# Patient Record
Sex: Female | Born: 1960 | Race: White | Hispanic: No | Marital: Married | Smoking: Never smoker
Health system: Southern US, Community
[De-identification: ages and names within clinical notes are randomized; demographics above are authoritative.]

## PROBLEM LIST (undated history)

## (undated) DIAGNOSIS — R0989 Other specified symptoms and signs involving the circulatory and respiratory systems: Secondary | ICD-10-CM

## (undated) DIAGNOSIS — T7840XA Allergy, unspecified, initial encounter: Secondary | ICD-10-CM

## (undated) DIAGNOSIS — F329 Major depressive disorder, single episode, unspecified: Secondary | ICD-10-CM

## (undated) DIAGNOSIS — F32A Depression, unspecified: Secondary | ICD-10-CM

## (undated) HISTORY — DX: Other specified symptoms and signs involving the circulatory and respiratory systems: R09.89

## (undated) HISTORY — PX: WISDOM TOOTH EXTRACTION: SHX21

## (undated) HISTORY — PX: JOINT REPLACEMENT: SHX530

## (undated) HISTORY — DX: Allergy, unspecified, initial encounter: T78.40XA

## (undated) HISTORY — PX: COLON SURGERY: SHX602

## (undated) HISTORY — DX: Major depressive disorder, single episode, unspecified: F32.9

## (undated) HISTORY — DX: Depression, unspecified: F32.A

---

## 2007-06-11 ENCOUNTER — Ambulatory Visit: Payer: Self-pay | Admitting: General Surgery

## 2007-12-30 ENCOUNTER — Ambulatory Visit: Payer: Self-pay | Admitting: Family Medicine

## 2009-08-17 IMAGING — RF DG UGI W/ SMALL BOWEL
2 series · 15 of 24 positions shown · non-contrast
Comparison: none

REASON FOR EXAM: diarrhea,   family history of Crohns
COMMENTS:

PROCEDURE:     FL  - FL UPPER GI WITH SMALL BOWEL  - June 11, 2007 [DATE]
RESULT:
HISTORY: Family history of Crohn's disease.

[Series 1: run · 7 of 13 slices shown (1 of 2)]
[im 1/13]
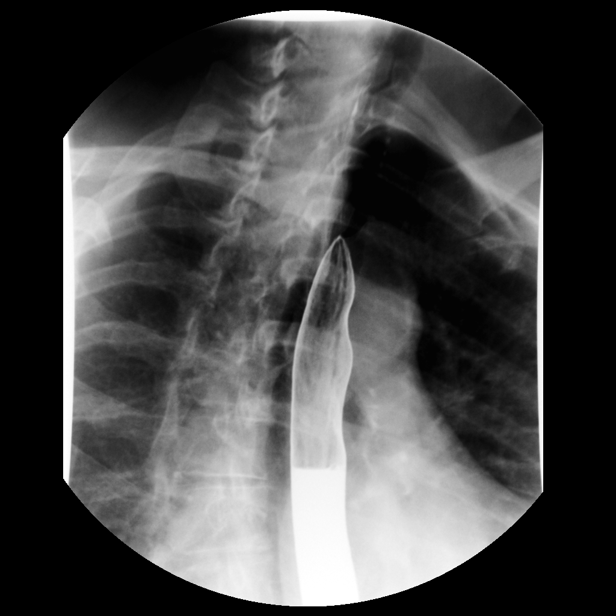
[im 3/13]
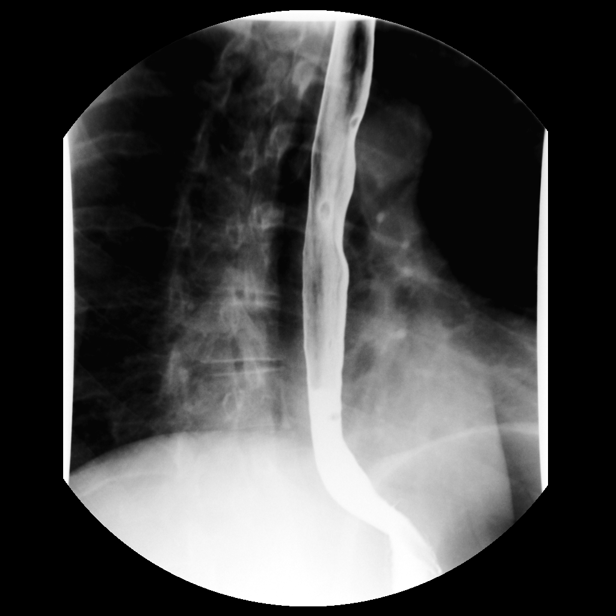
[im 5/13]
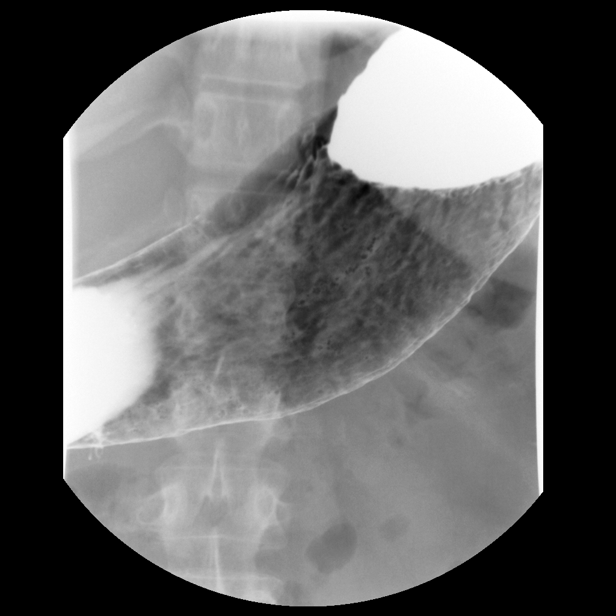
[im 6/13]
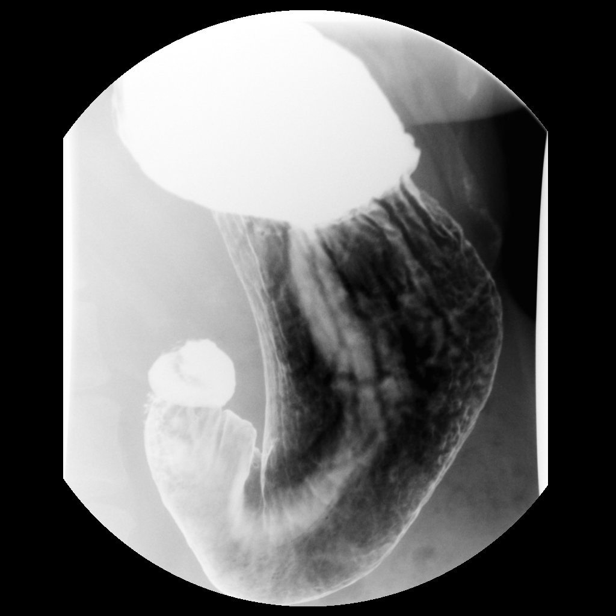
[im 8/13]
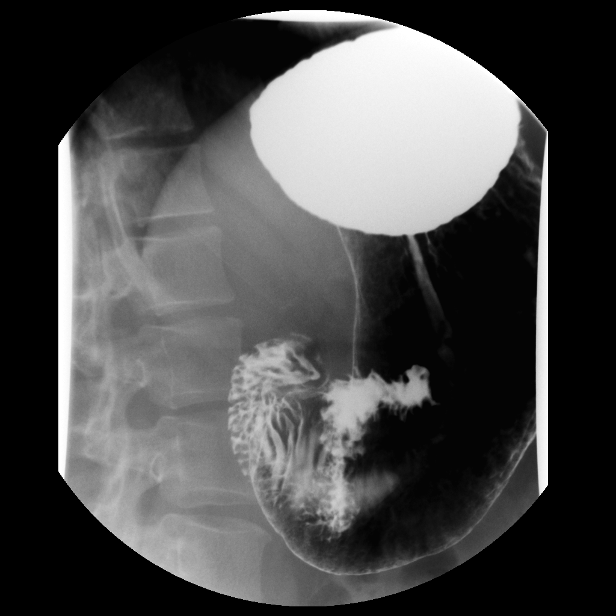
[im 9/13]
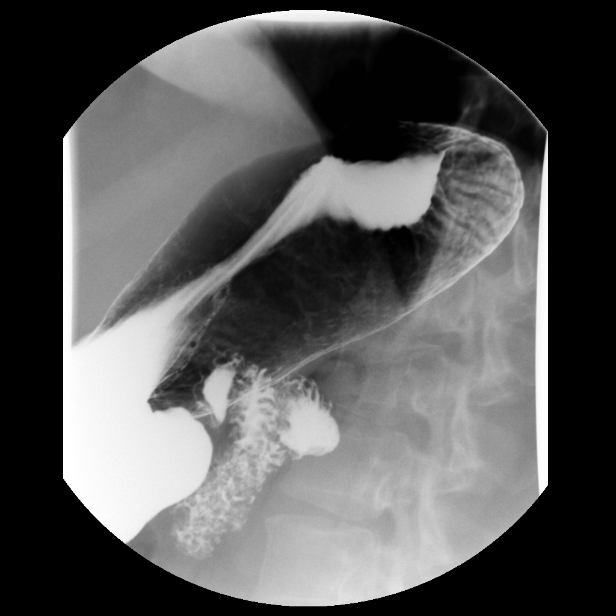
[im 11/13]
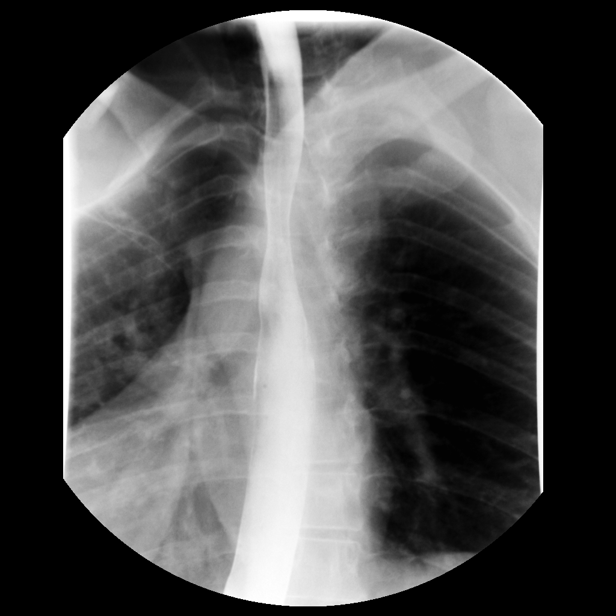

[Series 2: run · 8 of 14 slices shown (2 of 2)]
[im 1/14]
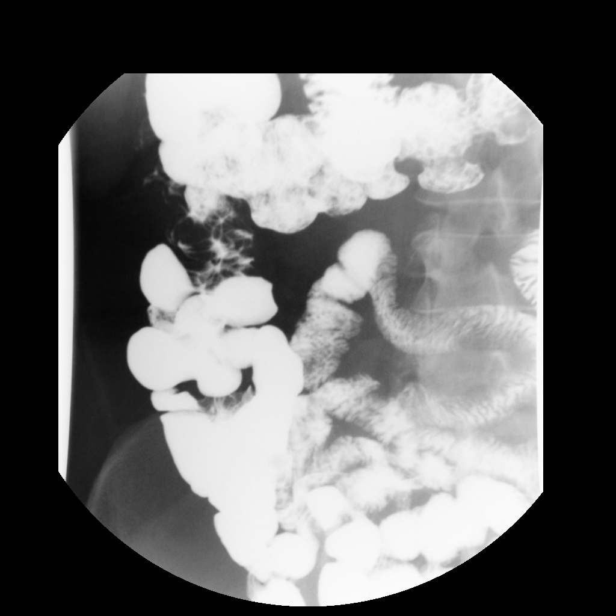
[im 2/14]
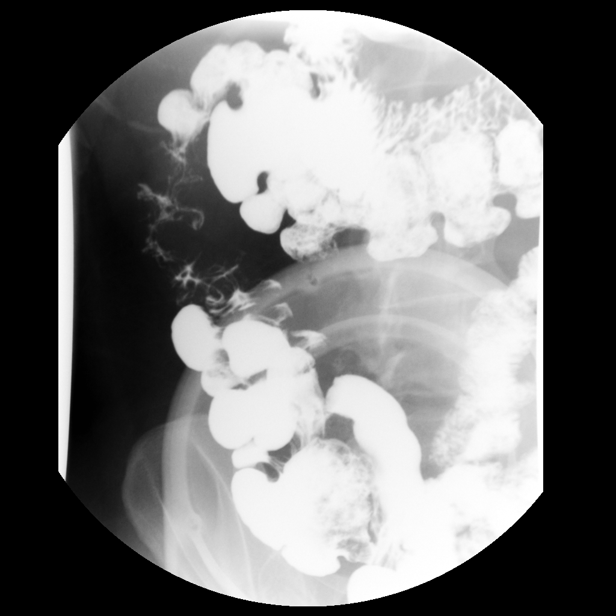
[im 4/14]
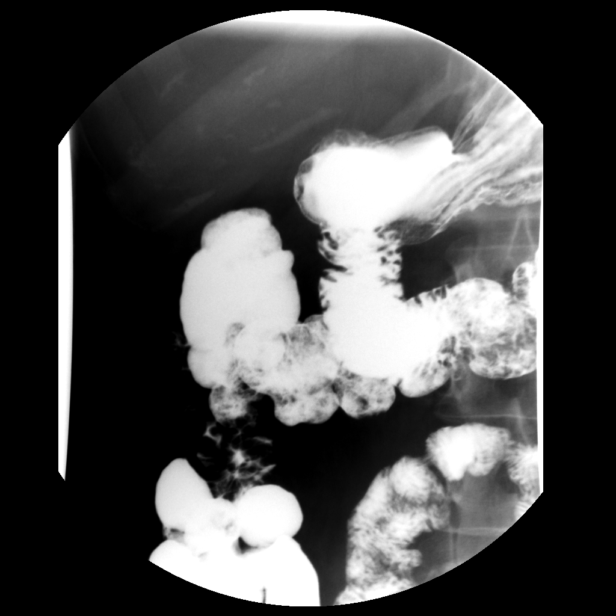
[im 5/14]
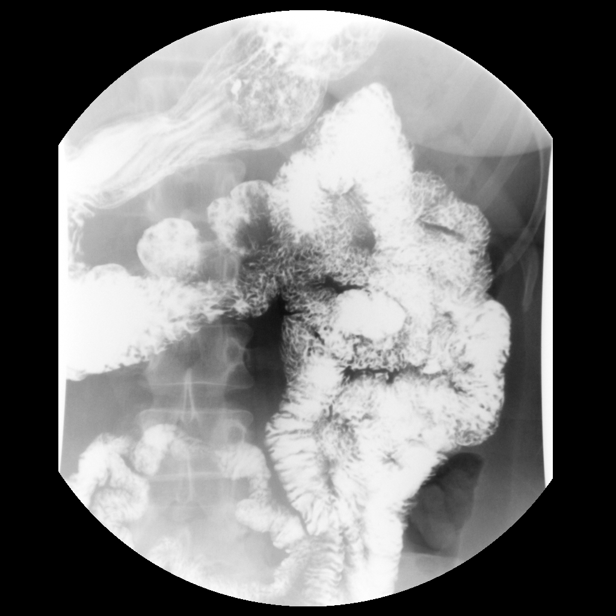
[im 8/14]
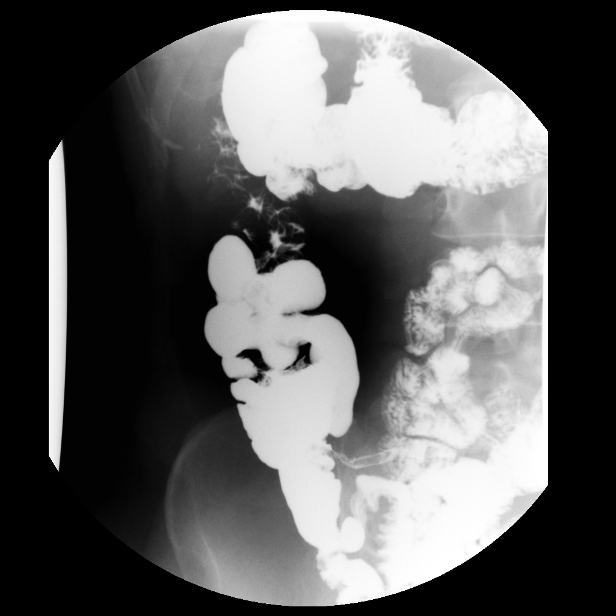
[im 10/14]
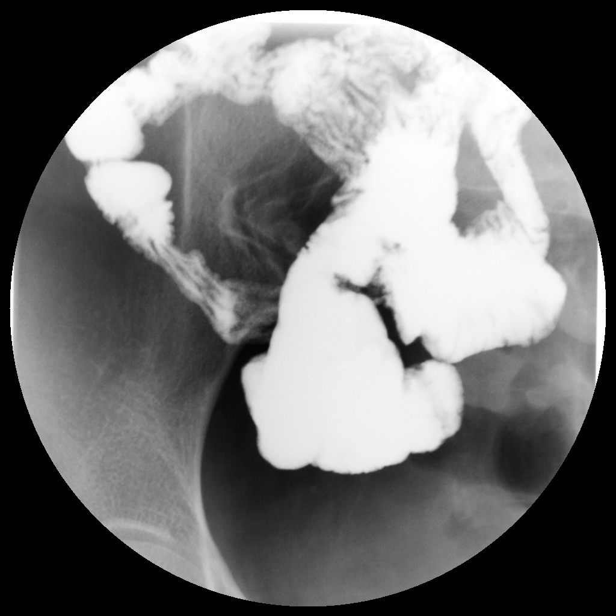
[im 11/14]
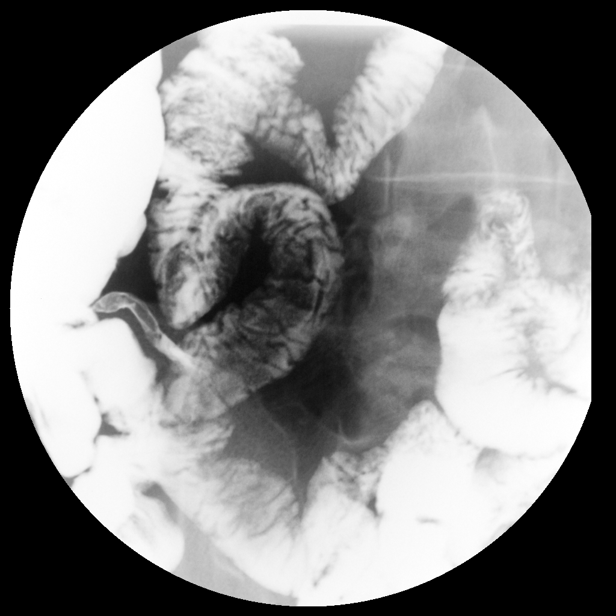
[im 14/14]
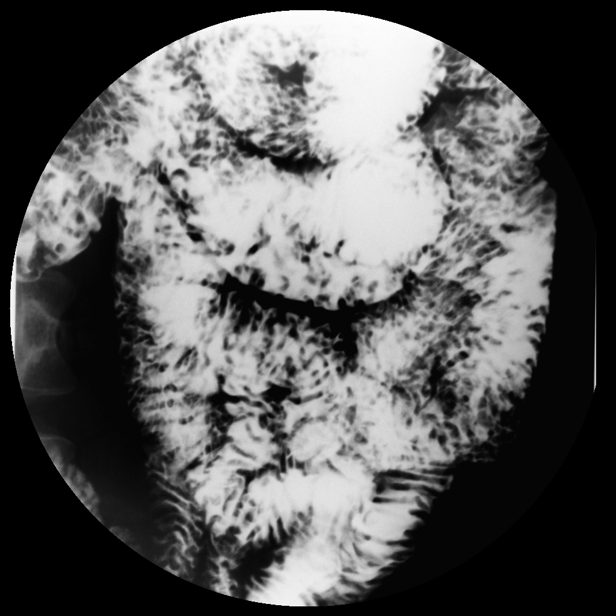

[15 of 24 positions shown; findings below may reference images not displayed]

FINDINGS: Esophageal and gastric mucosal pattern, peristaltic activity and
contour are normal.  The duodenal bulb and C-loop are normal. Mild
gastroesophageal reflux is present. The small bowel is normal. Fold
thickness and caliber is unremarkable. The terminal ileum is normal. The
appendix is present. The colon cannot be evaluated, and if Crohn's disease
is suspected, colonoscopy may prove useful.
IMPRESSION: 1.     Mild gastroesophageal reflux.
2.     Otherwise normal upper GI and small bowel follow-through.
3.     No evidence of Crohn's disease involving the small bowel.
4.     The terminal ileum appears normal.

## 2009-09-17 ENCOUNTER — Ambulatory Visit: Payer: Self-pay | Admitting: Internal Medicine

## 2010-03-07 IMAGING — CR DG CHEST 2V
1 series · 2 of 2 positions shown · non-contrast
Comparison: none

REASON FOR EXAM: cough
COMMENTS:

[Series 1: view not recorded · 0.17mm/px · 2 of 2 slices shown]
[im 1/2]
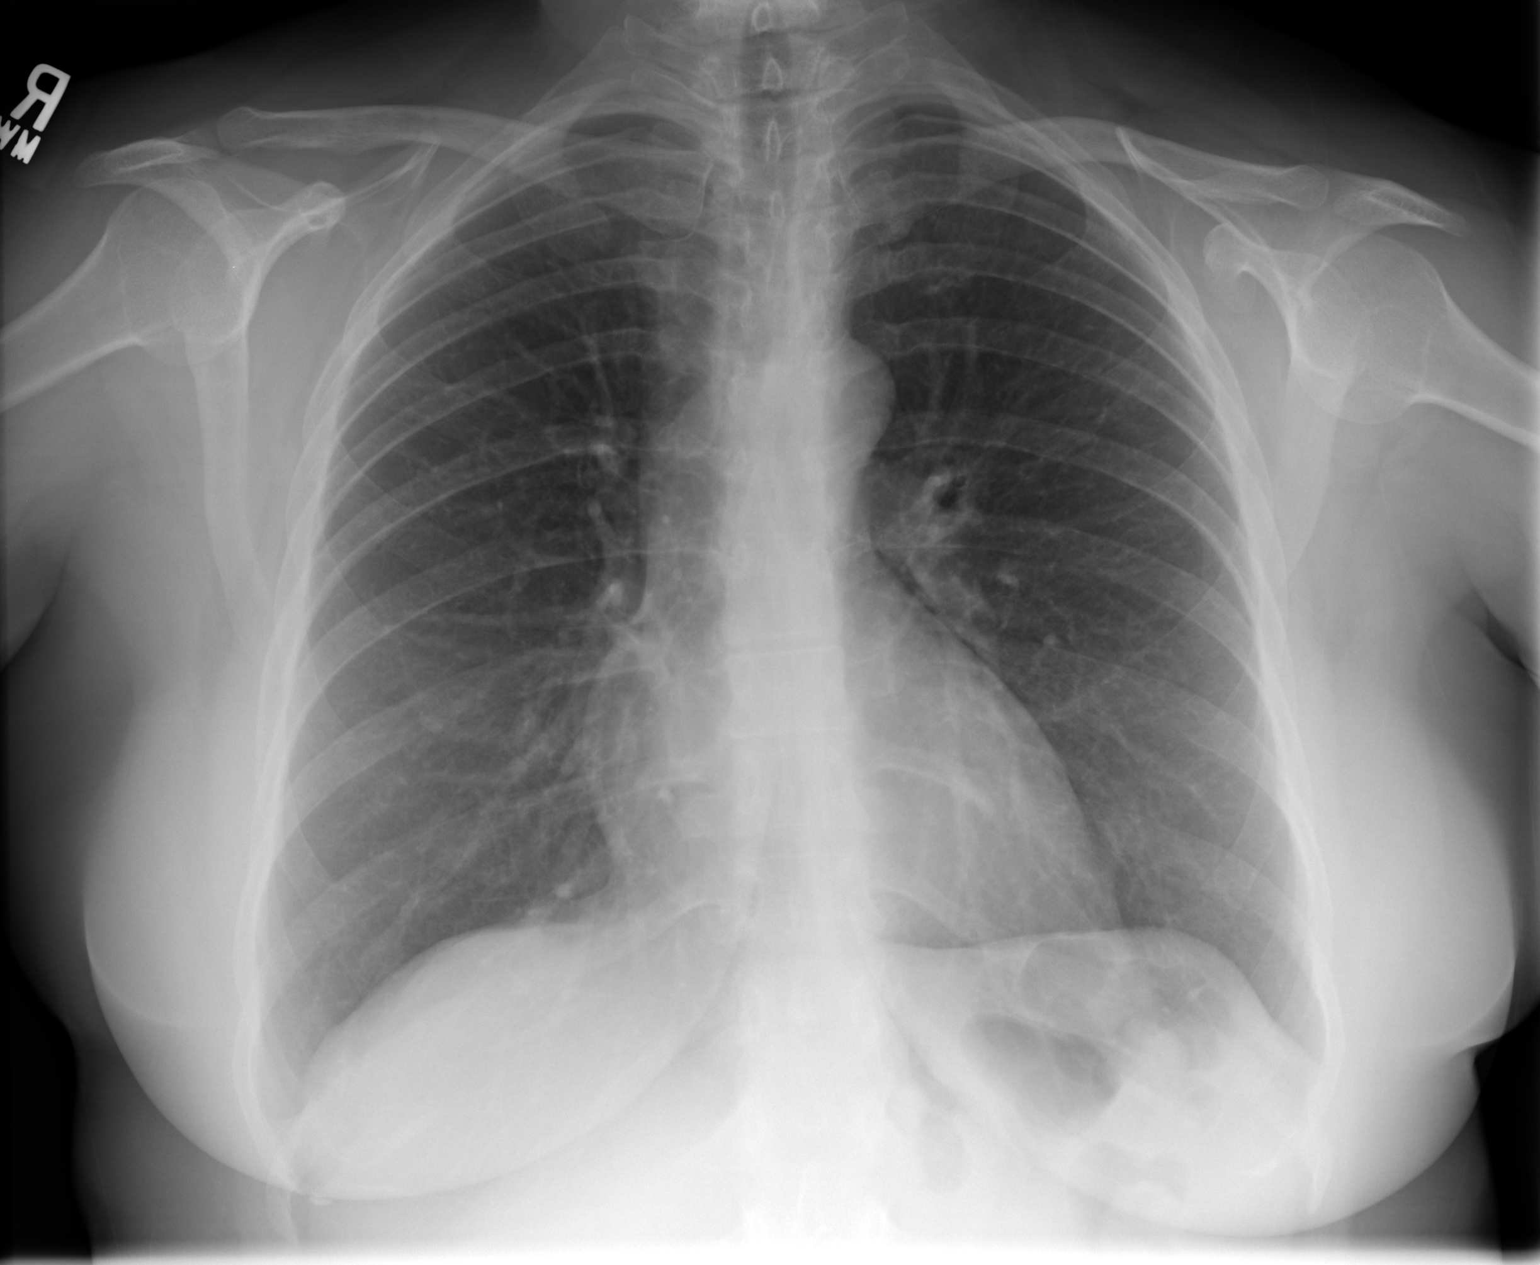
[im 2/2]
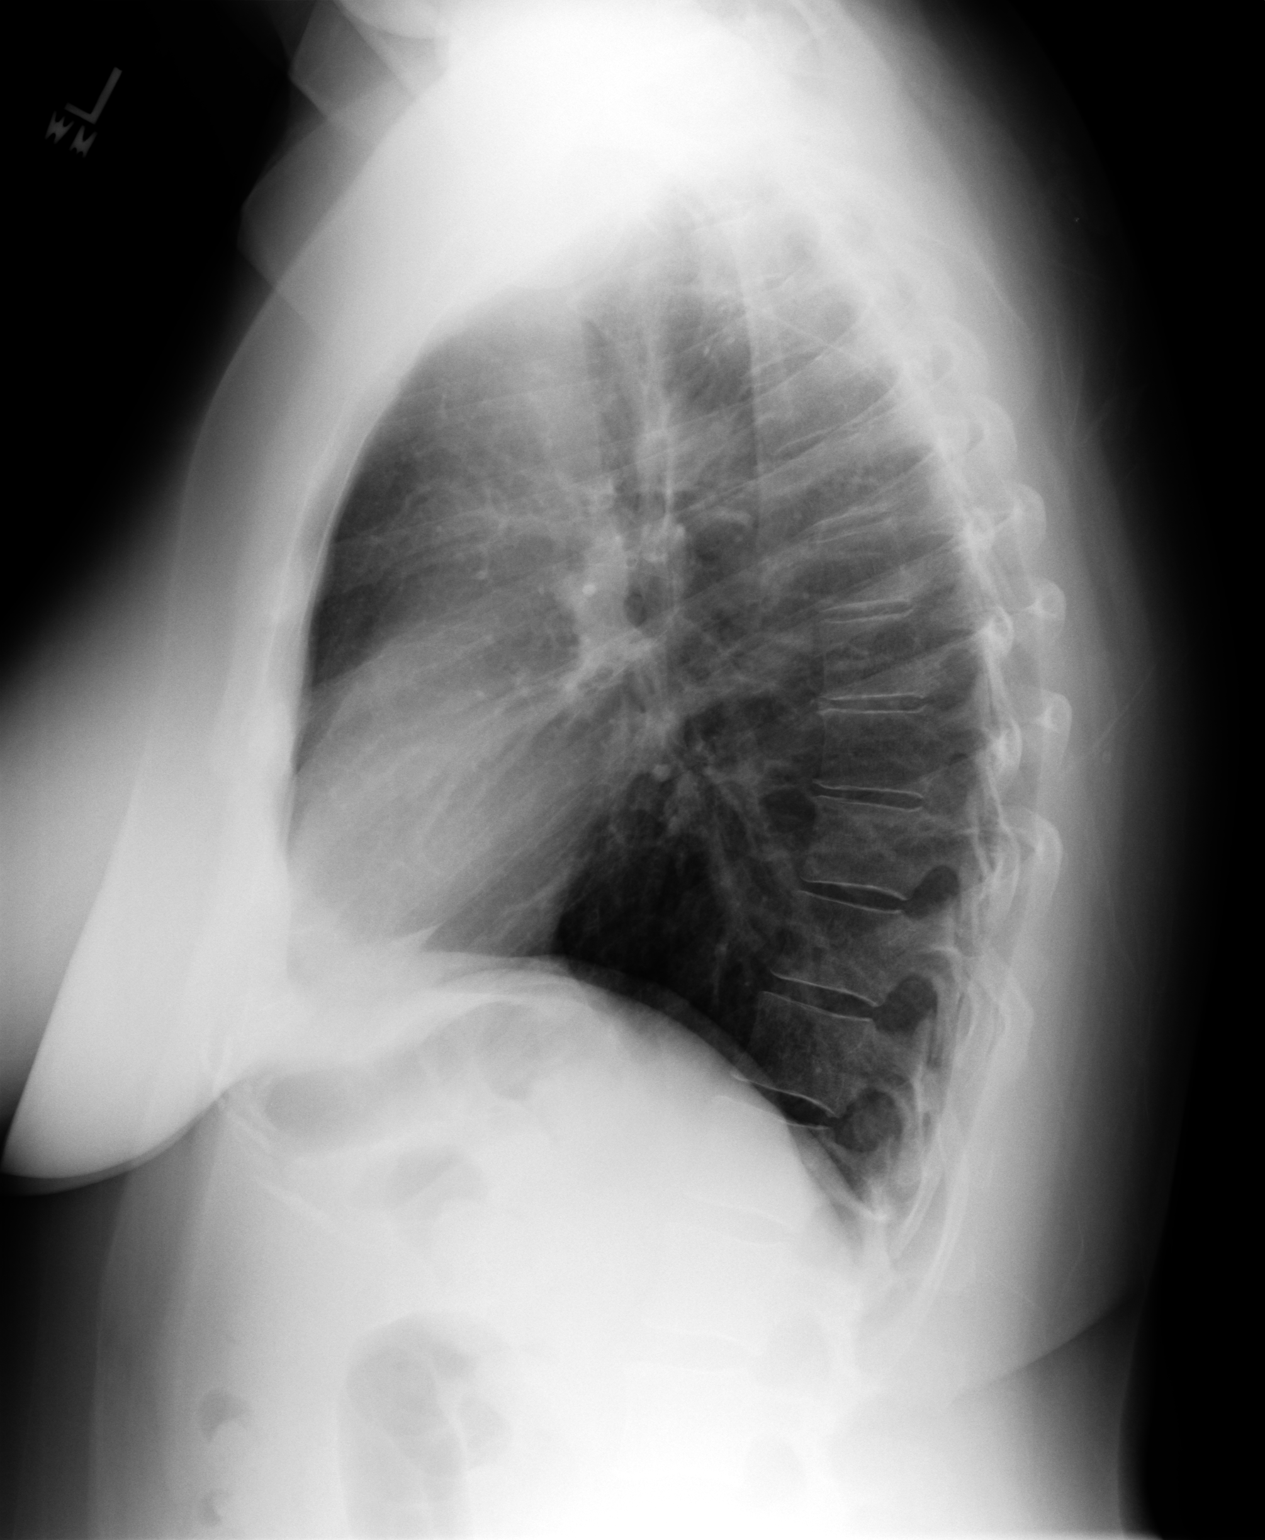

[2 of 2 positions shown; findings below may reference images not displayed]

PROCEDURE:     DXR - DXR CHEST PA (OR AP) AND LATERAL  - December 30, 2007  [DATE]

RESULT:     Has no previous examination for comparison.

The lungs are clear. The heart and pulmonary vessels are normal. The bony
and mediastinal structures are unremarkable. There is no effusion. There is
no pneumothorax or evidence of congestive failure.
IMPRESSION: No acute cardiopulmonary disease.

## 2011-09-27 ENCOUNTER — Encounter: Payer: Self-pay | Admitting: Obstetrics & Gynecology

## 2011-09-27 ENCOUNTER — Ambulatory Visit (INDEPENDENT_AMBULATORY_CARE_PROVIDER_SITE_OTHER): Payer: BC Managed Care – PPO | Admitting: Obstetrics & Gynecology

## 2011-09-27 VITALS — BP 132/98 | HR 77 | Ht 62.0 in | Wt 187.0 lb

## 2011-09-27 DIAGNOSIS — Z124 Encounter for screening for malignant neoplasm of cervix: Secondary | ICD-10-CM

## 2011-09-27 DIAGNOSIS — L910 Hypertrophic scar: Secondary | ICD-10-CM

## 2011-09-27 DIAGNOSIS — R232 Flushing: Secondary | ICD-10-CM

## 2011-09-27 DIAGNOSIS — Z01419 Encounter for gynecological examination (general) (routine) without abnormal findings: Secondary | ICD-10-CM

## 2011-09-27 MED ORDER — CONJ ESTROG-MEDROXYPROGEST ACE 0.45-1.5 MG PO TABS: 1.0000 | ORAL_TABLET | Freq: Every day | ORAL | Status: DC

## 2011-09-27 NOTE — Progress Notes (Signed)
Subjective:     Patient ID: Amy Evans, female   DOB: Mar 03, 1960, 51 y.o.   MRN: [id number]  HPI Pt is a G3P3003 for annual GYN exam.  Pt c/o severe vasomotor sx, mood changes and insomnia.  LMP 01/09/09.  Wants to consider HRT as she has 'tried everything else.'  Pt has large skin tags on rectum that she wants evaluated  Past Medical History  Diagnosis Date  . Allergy   . Depression   . Pulse irregularity    Soc Hx: no tob or etoh FH: non contributory PShx: colon fistula repair    Review of Systems    non contributary   Objective:   Physical ExamPt in NAD Afebrile VSS Lungs: CTA CV: RRR Abd: obses, ND, NT GU: EGBUS: no lesions Vagina: no blood in vault Cervix: no lesion; no mucopurulent d/c Uterus: small, mobile Adnexa: no masses; non tender  Rectum: 2 large ~2cm keloids extruding from rectum.         Assessment:     Menapausal sx Keloids on rectum and mons Routine GYN exam    Plan:    f/u PAP/ HPV F/u for treatment of Keloids- wants injection of Kenalog Premprom 0.45/1.5mg  daily  Amy Evans, M.D., Evern Core

## 2011-09-27 NOTE — Patient Instructions (Signed)

## 2011-10-10 ENCOUNTER — Ambulatory Visit: Payer: Self-pay | Admitting: Family Medicine

## 2011-11-27 ENCOUNTER — Encounter: Payer: Self-pay | Admitting: Obstetrics & Gynecology

## 2011-11-27 ENCOUNTER — Ambulatory Visit (INDEPENDENT_AMBULATORY_CARE_PROVIDER_SITE_OTHER): Payer: BC Managed Care – PPO | Admitting: Obstetrics & Gynecology

## 2011-11-27 VITALS — BP 128/92 | HR 77 | Ht 62.0 in | Wt 184.0 lb

## 2011-11-27 DIAGNOSIS — N951 Menopausal and female climacteric states: Secondary | ICD-10-CM

## 2011-11-27 MED ORDER — CONJ ESTROG-MEDROXYPROGEST ACE 0.625-2.5 MG PO TABS: 1.0000 | ORAL_TABLET | Freq: Every day | ORAL | Status: DC

## 2011-11-27 NOTE — Patient Instructions (Signed)

## 2011-11-27 NOTE — Progress Notes (Signed)
Subjective:     Patient ID: Amy Evans, female   DOB: 1960-07-14, 51 y.o.   MRN: [id number]  HPI  Pt started on ERT last visit for severe menopausal sx.  Pt reports that her life is much better now,  Decrease in vasomotor sx.  Pt is sleeping better but, she is still waking up nightly with hot flushes.  Past Medical History  Diagnosis Date  . Allergy   . Depression   . Pulse irregularity    Current outpatient prescriptions:atenolol (TENORMIN) 25 MG tablet, , Disp: , Rfl: ;  cetirizine (ZYRTEC) 10 MG tablet, Take 10 mg by mouth daily., Disp: , Rfl: ;  naproxen sodium (ANAPROX) 550 MG tablet, , Disp: , Rfl: ;  DISCONTD: estrogen, conjugated,-medroxyprogesterone (PREMPRO) 0.45-1.5 MG per tablet, Take 1 tablet by mouth daily., Disp: 30 tablet, Rfl: 2 estrogen, conjugated,-medroxyprogesterone (PREMPRO) 0.625-2.5 MG per tablet, Take 1 tablet by mouth daily., Disp: 30 tablet, Rfl: 3 Review of Systems     Objective:   Physical ExamBP 128/92  Pulse 77  Ht 5\' 2"  (1.575 m)  Wt 184 lb (83.462 kg)  BMI 33.65 kg/m2  deferred     Assessment:     Menopausal sx improved slightly on ERT. Pt desires increase in HRT      Plan:     increase to Prempro 0.626/2.5mg  F/u 3 months or sooner prn  Fredrick Geoghegan L. Harraway-Smith, M.D., [facility]n Core

## 2012-01-17 ENCOUNTER — Ambulatory Visit (INDEPENDENT_AMBULATORY_CARE_PROVIDER_SITE_OTHER): Payer: BC Managed Care – PPO | Admitting: Obstetrics and Gynecology

## 2012-01-17 VITALS — BP 131/96 | HR 75 | Ht 63.0 in | Wt 187.0 lb

## 2012-01-17 DIAGNOSIS — N390 Urinary tract infection, site not specified: Secondary | ICD-10-CM

## 2012-01-17 DIAGNOSIS — R232 Flushing: Secondary | ICD-10-CM

## 2012-01-17 LAB — POCT URINALYSIS DIPSTICK
Glucose, UA: NEGATIVE
Spec Grav, UA: <=1.005
Urobilinogen, UA: 0.2
pH, UA: 5

## 2012-01-17 NOTE — Progress Notes (Signed)
Patient ID: Amy Evans, female   DOB: 11/27/1960, 51 y.o.   MRN: [id number] Patient presents today for follow-up on Prempro. She reports significant improvement from since her dose has been increased. However, she does report 3 days of vaginal spotting. Informed patient that this is a normal side effect associated with estrogen. Advised patient to keep a menstrual calendar.  Informed patient that the goal of hormone replacement therapy is to manage symptoms on the lowest dose possible, for the shortest amount of time possible. Patient verbalized understanding and is willing to try to decrease her dose in a few months. Although she is happy with the absence of night sweats, she is not happy with her 3-day spotting and would like to eliminate that.   RTC in 6 months

## 2012-01-17 NOTE — Progress Notes (Signed)
Having uti symptoms.

## 2012-03-07 ENCOUNTER — Telehealth: Payer: Self-pay

## 2012-03-07 ENCOUNTER — Other Ambulatory Visit: Payer: Self-pay | Admitting: Gynecology

## 2012-03-07 DIAGNOSIS — N951 Menopausal and female climacteric states: Secondary | ICD-10-CM

## 2012-03-11 ENCOUNTER — Other Ambulatory Visit: Payer: Self-pay | Admitting: Obstetrics and Gynecology

## 2012-03-11 DIAGNOSIS — N951 Menopausal and female climacteric states: Secondary | ICD-10-CM

## 2012-03-11 MED ORDER — CONJ ESTROG-MEDROXYPROGEST ACE 0.45-1.5 MG PO TABS: 1.0000 | ORAL_TABLET | Freq: Every day | ORAL | Status: DC

## 2012-09-20 ENCOUNTER — Ambulatory Visit (INDEPENDENT_AMBULATORY_CARE_PROVIDER_SITE_OTHER): Payer: BC Managed Care – PPO | Admitting: Family Medicine

## 2012-09-20 ENCOUNTER — Encounter: Payer: Self-pay | Admitting: Family Medicine

## 2012-09-20 VITALS — BP 115/83 | HR 69 | Ht 62.0 in | Wt 197.0 lb

## 2012-09-20 DIAGNOSIS — N951 Menopausal and female climacteric states: Secondary | ICD-10-CM

## 2012-09-20 DIAGNOSIS — Z1239 Encounter for other screening for malignant neoplasm of breast: Secondary | ICD-10-CM

## 2012-09-20 DIAGNOSIS — Z780 Asymptomatic menopausal state: Secondary | ICD-10-CM

## 2012-09-20 DIAGNOSIS — Z01419 Encounter for gynecological examination (general) (routine) without abnormal findings: Secondary | ICD-10-CM

## 2012-09-20 MED ORDER — CONJ ESTROG-MEDROXYPROGEST ACE 0.45-1.5 MG PO TABS: 1.0000 | ORAL_TABLET | Freq: Every day | ORAL | Status: DC

## 2012-09-20 NOTE — Patient Instructions (Signed)

## 2012-09-21 NOTE — Progress Notes (Signed)
  Subjective:    Patient ID: Amy Evans, female    DOB: 10/07/1960, 52 y.o.   MRN: [id number]  HPI  Here for f/u.  Has had 1 year of HRT at low dose.  Some bleeding noted with initiation, but that stopped after dose decrease.  She notes that she is much improved in terms of quality of life.  She can sleep now, is not suffering from the mood swings and hot flashes as previously. Had pap smear last year without HPV typing, but WNL. Had normal mammogram last year.  No family h/o breast ca.  Review of Systems  Constitutional: Negative for fever, appetite change and fatigue.  Respiratory: Negative for shortness of breath.   Cardiovascular: Negative for chest pain.  Gastrointestinal: Negative for nausea, vomiting, abdominal pain, diarrhea and constipation.  Genitourinary: Negative for dysuria and vaginal bleeding.  Skin: Negative for rash.       Objective:   Physical Exam  Vitals reviewed. Constitutional: She appears well-developed and well-nourished. No distress.  HENT:  Head: Normocephalic and atraumatic.  Neck: Neck supple.  Cardiovascular: Normal rate.   No murmur heard. Pulmonary/Chest: Effort normal. No respiratory distress.  Abdominal: Soft. There is no tenderness.          Assessment & Plan:

## 2012-09-21 NOTE — Assessment & Plan Note (Signed)
Advised pap in 2 moe years with HPV typing and mammogram this year since on HRT.

## 2012-09-21 NOTE — Assessment & Plan Note (Signed)
Continue current dose of medications for now--will address weaning off at 2-3 year intervals.  Re-discussed risks/benefits of HRT.

## 2012-12-16 ENCOUNTER — Other Ambulatory Visit: Payer: Self-pay | Admitting: Family Medicine

## 2012-12-26 NOTE — Telephone Encounter (Signed)
Error

## 2013-02-17 ENCOUNTER — Other Ambulatory Visit: Payer: Self-pay | Admitting: Family Medicine

## 2013-08-12 ENCOUNTER — Other Ambulatory Visit: Payer: Self-pay | Admitting: Family Medicine

## 2013-08-25 ENCOUNTER — Telehealth: Payer: Self-pay | Admitting: *Deleted

## 2013-08-25 MED ORDER — CONJ ESTROG-MEDROXYPROGEST ACE 0.45-1.5 MG PO TABS: ORAL_TABLET | ORAL | Status: DC

## 2013-08-25 NOTE — Telephone Encounter (Signed)
Patient is requesting a refill of her Prempro.

## 2013-12-22 ENCOUNTER — Encounter: Payer: Self-pay | Admitting: Family Medicine

## 2014-09-01 ENCOUNTER — Other Ambulatory Visit: Payer: Self-pay | Admitting: Family Medicine

## 2014-10-01 ENCOUNTER — Other Ambulatory Visit: Payer: Self-pay | Admitting: Family Medicine

## 2014-10-08 ENCOUNTER — Ambulatory Visit: Payer: BC Managed Care – PPO | Admitting: Obstetrics and Gynecology

## 2017-02-04 ENCOUNTER — Other Ambulatory Visit: Payer: Self-pay | Admitting: Internal Medicine

## 2017-05-02 ENCOUNTER — Other Ambulatory Visit: Payer: Self-pay | Admitting: Internal Medicine

## 2017-07-04 ENCOUNTER — Other Ambulatory Visit: Payer: Self-pay | Admitting: Internal Medicine

## 2017-07-04 ENCOUNTER — Other Ambulatory Visit: Payer: Self-pay | Admitting: Nurse Practitioner

## 2017-07-04 MED ORDER — METOPROLOL SUCCINATE ER 25 MG PO TB24: 25.0000 mg | ORAL_TABLET | Freq: Every day | ORAL | 3 refills | Status: DC

## 2017-11-14 ENCOUNTER — Other Ambulatory Visit: Payer: Self-pay | Admitting: Internal Medicine

## 2017-11-21 ENCOUNTER — Encounter: Payer: Self-pay | Admitting: Adult Health

## 2017-11-21 ENCOUNTER — Ambulatory Visit: Payer: BC Managed Care – PPO | Admitting: Adult Health

## 2017-11-21 VITALS — BP 137/87 | HR 80 | Resp 16 | Ht 63.0 in | Wt 178.8 lb

## 2017-11-21 DIAGNOSIS — Z23 Encounter for immunization: Secondary | ICD-10-CM | POA: Diagnosis not present

## 2017-11-21 DIAGNOSIS — Z780 Asymptomatic menopausal state: Secondary | ICD-10-CM

## 2017-11-21 DIAGNOSIS — I10 Essential (primary) hypertension: Secondary | ICD-10-CM

## 2017-11-21 DIAGNOSIS — M5430 Sciatica, unspecified side: Secondary | ICD-10-CM | POA: Diagnosis not present

## 2017-11-21 DIAGNOSIS — J0110 Acute frontal sinusitis, unspecified: Secondary | ICD-10-CM

## 2017-11-21 MED ORDER — METOPROLOL SUCCINATE ER 25 MG PO TB24: 25.0000 mg | ORAL_TABLET | Freq: Every day | ORAL | 3 refills | Status: DC

## 2017-11-21 MED ORDER — CYCLOBENZAPRINE HCL 10 MG PO TABS: 10.0000 mg | ORAL_TABLET | Freq: Three times a day (TID) | ORAL | 0 refills | Status: AC | PRN

## 2017-11-21 MED ORDER — CONJ ESTROG-MEDROXYPROGEST ACE 0.45-1.5 MG PO TABS: 1.0000 | ORAL_TABLET | Freq: Every day | ORAL | 2 refills | Status: DC

## 2017-11-21 MED ORDER — AMOXICILLIN-POT CLAVULANATE 875-125 MG PO TABS: 1.0000 | ORAL_TABLET | Freq: Two times a day (BID) | ORAL | 0 refills | Status: DC

## 2017-11-21 MED ORDER — PREDNISONE 10 MG PO TABS: ORAL_TABLET | ORAL | 0 refills | Status: DC

## 2017-11-21 NOTE — Progress Notes (Addendum)
[facility] [address] [place], [place] [address]  Internal MEDICINE  Office Visit Note  Patient Name: Amy Evans  [id number]  [id number]  Date of Service: 11/26/2017  Chief Complaint  Patient presents with  . Pain    Hip pain, both sides, more like running down the sciatic nerve  . Cough    cough congestion, head pressure been using zyrtec and flonace, no fever or chills, no body aches   HPI Pt is here for a sick visit. She reports cough, head congestion and pressure.  She denies fever, chills, body aches or sinus pain.  She is also reporting bilateral hip pain from sciatic nerve pain.  For which she has a history. She had in the past had prednisone tapers and flexeril for it.  She reports she has been stretching and attempting to decrease the pain on her own.    Current Medication:  Outpatient Encounter Medications as of 11/21/2017  Medication Sig  . cetirizine (ZYRTEC) 10 MG tablet Take 10 mg by mouth daily.  Marland Kitchen estrogen, conjugated,-medroxyprogesterone (PREMPRO) 0.45-1.5 MG tablet Take 1 tablet by mouth daily.  . metoprolol succinate (TOPROL-XL) 25 MG 24 hr tablet Take 1 tablet (25 mg total) by mouth daily.  . raNITIdine HCl (ZANTAC PO) Take by mouth.  . [DISCONTINUED] metoprolol succinate (TOPROL-XL) 25 MG 24 hr tablet Take 1 tablet (25 mg total) by mouth daily.  . [DISCONTINUED] PREMPRO 0.45-1.5 MG tablet TAKE 1 TABLET BY MOUTH EVERY DAY  . amoxicillin-clavulanate (AUGMENTIN) 875-125 MG tablet Take 1 tablet by mouth 2 (two) times daily.  . cyclobenzaprine (FLEXERIL) 10 MG tablet Take 1 tablet (10 mg total) by mouth 3 (three) times daily as needed for muscle spasms.  . naproxen sodium (ANAPROX) 550 MG tablet   . predniSONE (DELTASONE) 10 MG tablet Use per dose pack  . [DISCONTINUED] atenolol (TENORMIN) 25 MG tablet    No facility-administered encounter medications on file as of 11/21/2017.     Medical History: Past Medical History:  Diagnosis Date  .  Allergy   . Depression   . Pulse irregularity    Vital Signs: BP 137/87 (BP Location: Right Arm, Patient Position: Sitting, Cuff Size: Large)   Pulse 80   Resp 16   Ht 5\' 3"  (1.6 m)   Wt 178 lb 12.8 oz (81.1 kg)   SpO2 97%   BMI 31.67 kg/m   Review of Systems  Constitutional: Negative for chills, fatigue and unexpected weight change.  HENT: Negative for congestion, rhinorrhea, sneezing and sore throat.   Eyes: Negative for photophobia, pain and redness.  Respiratory: Negative for cough, chest tightness and shortness of breath.   Cardiovascular: Negative for chest pain and palpitations.  Gastrointestinal: Negative for abdominal pain, constipation, diarrhea, nausea and vomiting.  Endocrine: Negative.   Genitourinary: Negative for dysuria and frequency.  Musculoskeletal: Negative for arthralgias, back pain, joint swelling and neck pain.  Skin: Negative for rash.  Allergic/Immunologic: Negative.   Neurological: Negative for tremors and numbness.  Hematological: Negative for adenopathy. Does not bruise/bleed easily.  Psychiatric/Behavioral: Negative for behavioral problems and sleep disturbance. The patient is not nervous/anxious.     Physical Exam  Constitutional: She is oriented to person, place, and time. She appears well-developed and well-nourished. No distress.  HENT:  Head: Normocephalic and atraumatic.  Mouth/Throat: Oropharynx is clear and moist. No oropharyngeal exudate.  Eyes: Pupils are equal, round, and reactive to light. EOM are normal.  Neck: Normal range of motion. Neck supple. No  JVD present. No tracheal deviation present. No thyromegaly present.  Cardiovascular: Normal rate, regular rhythm and normal heart sounds. Exam reveals no gallop and no friction rub.  No murmur heard. Pulmonary/Chest: Effort normal and breath sounds normal. No respiratory distress. She has no wheezes. She has no rales. She exhibits no tenderness.  Abdominal: Soft. There is no tenderness.  There is no guarding.  Musculoskeletal: Normal range of motion.  Lymphadenopathy:    She has no cervical adenopathy.  Neurological: She is alert and oriented to person, place, and time. No cranial nerve deficit.  Skin: Skin is warm and dry. She is not diaphoretic.  Psychiatric: She has a normal mood and affect. Her behavior is normal. Judgment and thought content normal.  Nursing note and vitals reviewed.   Assessment/Plan: 1. Acute non-recurrent frontal sinusitis Take Augmentin, return to clinic if symptoms worsen or fail to improve.-  amoxicillin-clavulanate (AUGMENTIN) 875-125 MG tablet; Take 1 tablet by mouth 2 (two) times daily.  Dispense: 20 tablet; Refill: 0  2. Menopause Refilled medication.  Continue current therapy.  - estrogen, conjugated,-medroxyprogesterone (PREMPRO) 0.45-1.5 MG tablet; Take 1 tablet by mouth daily.  Dispense: 84 tablet; Refill: 2  3. Essential hypertension Stable, continue current treatment.  - metoprolol succinate (TOPROL-XL) 25 MG 24 hr tablet; Take 1 tablet (25 mg total) by mouth daily.  Dispense: 90 tablet; Refill: 3  4. Sciatic leg pain Use flexeril and deltasone as indicated and discussed - cyclobenzaprine (FLEXERIL) 10 MG tablet; Take 1 tablet (10 mg total) by mouth 3 (three) times daily as needed for muscle spasms.  Dispense: 30 tablet; Refill: 0 - predniSONE (DELTASONE) 10 MG tablet; Use per dose pack  Dispense: 21 tablet; Refill: 0  5. Needs flu shot - Flu Vaccine MDCK QUAD PF  General Counseling: Pearson verbalizes understanding of the findings of todays visit and agrees with plan of treatment. I have discussed any further diagnostic evaluation that may be needed or ordered today. We also reviewed her medications today. she has been encouraged to call the office with any questions or concerns that should arise related to todays visit.   Orders Placed This Encounter  Procedures  . Flu Vaccine MDCK QUAD PF    Meds ordered this encounter   Medications  . cyclobenzaprine (FLEXERIL) 10 MG tablet    Sig: Take 1 tablet (10 mg total) by mouth 3 (three) times daily as needed for muscle spasms.    Dispense:  30 tablet    Refill:  0  . predniSONE (DELTASONE) 10 MG tablet    Sig: Use per dose pack    Dispense:  21 tablet    Refill:  0  . amoxicillin-clavulanate (AUGMENTIN) 875-125 MG tablet    Sig: Take 1 tablet by mouth 2 (two) times daily.    Dispense:  20 tablet    Refill:  0  . metoprolol succinate (TOPROL-XL) 25 MG 24 hr tablet    Sig: Take 1 tablet (25 mg total) by mouth daily.    Dispense:  90 tablet    Refill:  3  . estrogen, conjugated,-medroxyprogesterone (PREMPRO) 0.45-1.5 MG tablet    Sig: Take 1 tablet by mouth daily.    Dispense:  84 tablet    Refill:  2    Time spent: 25 Minutes  This patient was seen by Blima Ledger AGNP-C in Collaboration with Dr Lyndon Code as a part of collaborative care agreement

## 2018-08-06 ENCOUNTER — Other Ambulatory Visit: Payer: Self-pay | Admitting: Adult Health

## 2018-08-06 DIAGNOSIS — Z780 Asymptomatic menopausal state: Secondary | ICD-10-CM

## 2018-08-07 NOTE — Telephone Encounter (Signed)
Left message for patient in regards to prescription refills, needs appt for further refills

## 2018-09-05 ENCOUNTER — Ambulatory Visit: Payer: BC Managed Care – PPO | Admitting: Nurse Practitioner

## 2019-01-10 ENCOUNTER — Ambulatory Visit: Payer: BC Managed Care – PPO | Admitting: Nurse Practitioner

## 2019-01-10 ENCOUNTER — Other Ambulatory Visit: Payer: Self-pay

## 2019-01-10 ENCOUNTER — Encounter: Payer: Self-pay | Admitting: Nurse Practitioner

## 2019-01-10 VITALS — BP 154/90 | HR 96 | Temp 97.9°F | Resp 16 | Ht 63.0 in | Wt 202.8 lb

## 2019-01-10 DIAGNOSIS — Z23 Encounter for immunization: Secondary | ICD-10-CM

## 2019-01-10 DIAGNOSIS — Z780 Asymptomatic menopausal state: Secondary | ICD-10-CM | POA: Diagnosis not present

## 2019-01-10 DIAGNOSIS — K219 Gastro-esophageal reflux disease without esophagitis: Secondary | ICD-10-CM

## 2019-01-10 DIAGNOSIS — I10 Essential (primary) hypertension: Secondary | ICD-10-CM | POA: Diagnosis not present

## 2019-01-10 DIAGNOSIS — R635 Abnormal weight gain: Secondary | ICD-10-CM | POA: Diagnosis not present

## 2019-01-10 DIAGNOSIS — J3089 Other allergic rhinitis: Secondary | ICD-10-CM

## 2019-01-10 MED ORDER — PREMPRO 0.45-1.5 MG PO TABS: 1.0000 | ORAL_TABLET | Freq: Every day | ORAL | 3 refills | Status: DC

## 2019-01-10 MED ORDER — CETIRIZINE HCL 10 MG PO TABS: 10.0000 mg | ORAL_TABLET | Freq: Every day | ORAL | 3 refills | Status: AC

## 2019-01-10 MED ORDER — FAMOTIDINE 20 MG PO TABS: 20.0000 mg | ORAL_TABLET | Freq: Two times a day (BID) | ORAL | 3 refills | Status: AC

## 2019-01-10 MED ORDER — METOPROLOL SUCCINATE ER 25 MG PO TB24: 25.0000 mg | ORAL_TABLET | Freq: Every day | ORAL | 3 refills | Status: AC

## 2019-01-10 NOTE — Progress Notes (Signed)
[facility] [address] [place], [place] [address]  Internal MEDICINE  Office Visit Note  Patient Name: Amy Evans  [id number] 12, 2062  [id number]   Date of Service: 01/19/2019  Chief Complaint  Patient presents with  . Hypertension  . Allergies  . Gastroesophageal Reflux    pt need med for acid reflux    Amy Evans presents for a routine annual follow-up and flu shot. She reports gaining approximately 30 lbs this year and is interested in an appetite suppressant for weight management. She also reports that she has been taking a half tablet of her metoprolol succinate, and is unsure whether the tabs are 25 mg or 50 mg. She requests prescriptions for famotidine, cetirizine, and Flonase because she cannot find them anywhere in stock OTC.      Current Medication: Outpatient Encounter Medications as of 01/10/2019  Medication Sig  . cetirizine (ZYRTEC) 10 MG tablet Take 1 tablet (10 mg total) by mouth daily.  . cyclobenzaprine (FLEXERIL) 10 MG tablet Take 1 tablet (10 mg total) by mouth 3 (three) times daily as needed for muscle spasms.  Marland Kitchen. estrogen, conjugated,-medroxyprogesterone (PREMPRO) 0.45-1.5 MG tablet Take 1 tablet by mouth daily.  . metoprolol succinate (TOPROL-XL) 25 MG 24 hr tablet Take 1 tablet (25 mg total) by mouth daily.  . [DISCONTINUED] cetirizine (ZYRTEC) 10 MG tablet Take 10 mg by mouth daily.  . [DISCONTINUED] metoprolol succinate (TOPROL-XL) 25 MG 24 hr tablet Take 1 tablet (25 mg total) by mouth daily.  . [DISCONTINUED] PREMPRO 0.45-1.5 MG tablet TAKE 1 TABLET BY MOUTH DAILY  . famotidine (PEPCID) 20 MG tablet Take 1 tablet (20 mg total) by mouth 2 (two) times daily.  . [DISCONTINUED] amoxicillin-clavulanate (AUGMENTIN) 875-125 MG tablet Take 1 tablet by mouth 2 (two) times daily. (Patient not taking: Reported on 01/10/2019)  . [DISCONTINUED] naproxen sodium (ANAPROX) 550 MG tablet   . [DISCONTINUED] predniSONE (DELTASONE) 10 MG tablet Use per dose  pack (Patient not taking: Reported on 01/10/2019)  . [DISCONTINUED] raNITIdine HCl (ZANTAC PO) Take by mouth.   No facility-administered encounter medications on file as of 01/10/2019.     Surgical History: Past Surgical History:  Procedure Laterality Date  . COLON SURGERY     fistula repair  . JOINT REPLACEMENT     right knee surgery  . WISDOM TOOTH EXTRACTION     x4    Medical History: Past Medical History:  Diagnosis Date  . Allergy   . Depression   . Pulse irregularity     Family History: Family History  Problem Relation Age of Onset  . Cancer Mother        lung  . Mental retardation Sister        chrons disease    Social History   Socioeconomic History  . Marital status: Married    Spouse name: Not on file  . Number of children: Not on file  . Years of education: Not on file  . Highest education level: Not on file  Occupational History  . Not on file  Social Needs  . Financial resource strain: Not on file  . Food insecurity    Worry: Not on file    Inability: Not on file  . Transportation needs    Medical: Not on file    Non-medical: Not on file  Tobacco Use  . Smoking status: Never Smoker  . Smokeless tobacco: Never Used  Substance and Sexual Activity  . Alcohol use: Yes    Comment:  social  . Drug use: No  . Sexual activity: Yes    Partners: Male    Birth control/protection: Post-menopausal  Lifestyle  . Physical activity    Days per week: Not on file    Minutes per session: Not on file  . Stress: Not on file  Relationships  . Social Musician on phone: Not on file    Gets together: Not on file    Attends religious service: Not on file    Active member of club or organization: Not on file    Attends meetings of clubs or organizations: Not on file    Relationship status: Not on file  . Intimate partner violence    Fear of current or ex partner: Not on file    Emotionally abused: Not on file    Physically abused: Not on file     Forced sexual activity: Not on file  Other Topics Concern  . Not on file  Social History Narrative  . Not on file      Review of Systems  Constitutional: Positive for unexpected weight change. Negative for chills, fatigue and fever.       Unwanted weight gain.  HENT: Negative for congestion, ear pain, rhinorrhea, sinus pressure, sinus pain, sore throat and tinnitus.   Respiratory: Negative for chest tightness, shortness of breath and wheezing.   Cardiovascular: Negative for chest pain and palpitations.       Elevated blood pressure.  Gastrointestinal: Negative for abdominal pain, constipation, diarrhea, nausea and vomiting.  Endocrine: Negative for cold intolerance, heat intolerance, polydipsia and polyuria.  Musculoskeletal: Negative for back pain, joint swelling and neck stiffness.  Skin: Negative for rash.  Allergic/Immunologic: Positive for environmental allergies. Negative for food allergies.       Black willow tree, ragweed, dust mites, dogs, cats  Neurological: Negative for dizziness, weakness, light-headedness, numbness and headaches.  Hematological: Negative for adenopathy.  Psychiatric/Behavioral: Positive for sleep disturbance. Negative for dysphoric mood. The patient is nervous/anxious.        Reports some situational nervousness/anxiety and restless nights thinking about work and COVID-19    Today's Vitals   01/10/19 1433  BP: (!) 154/90  Pulse: 96  Resp: 16  Temp: 97.9 F (36.6 C)  SpO2: 96%  Weight: 202 lb 12.8 oz (92 kg)  Height:  (1.6 m)   Body mass index is 35.92 kg/m.  Physical Exam Vitals signs and nursing note reviewed.  Constitutional:      Appearance: Normal appearance.  HENT:     Head: Normocephalic and atraumatic.     Nose: Nose normal.  Eyes:     Extraocular Movements: Extraocular movements intact.     Pupils: Pupils are equal, round, and reactive to light.  Neck:     Musculoskeletal: Normal range of motion and neck supple.   Cardiovascular:     Rate and Rhythm: Normal rate and regular rhythm.     Pulses: Normal pulses.     Heart sounds: Normal heart sounds.  Pulmonary:     Effort: Pulmonary effort is normal.     Breath sounds: Normal breath sounds.  Abdominal:     General: There is no distension.     Palpations: Abdomen is soft. There is no mass.     Tenderness: There is no right CVA tenderness, left CVA tenderness or guarding.  Musculoskeletal: Normal range of motion.  Skin:    General: Skin is warm and dry.     Capillary Refill:  Capillary refill takes less than 2 seconds.  Neurological:     General: No focal deficit present.     Mental Status: She is alert and oriented to person, place, and time.  Psychiatric:        Mood and Affect: Mood normal.        Behavior: Behavior normal.        Thought Content: Thought content normal.        Judgment: Judgment normal.   Assessment/Plan: 1. Essential hypertension Increase toprol XL 25mg  tablet daily. Recommend she limit salt intake and increase water.  - metoprolol succinate (TOPROL-XL) 25 MG 24 hr tablet; Take 1 tablet (25 mg total) by mouth daily.  Dispense: 90 tablet; Refill: 3  2. Menopause - estrogen, conjugated,-medroxyprogesterone (PREMPRO) 0.45-1.5 MG tablet; Take 1 tablet by mouth daily.  Dispense: 84 tablet; Refill: 3  3. Gastroesophageal reflux disease without esophagitis - famotidine (PEPCID) 20 MG tablet; Take 1 tablet (20 mg total) by mouth 2 (two) times daily.  Dispense: 180 tablet; Refill: 3  4. Abnormal weight gain Recommend limiting calorie intake to 1200-1500 calories per day and incorporating exercise into daily routine. Consider stimulant appetite suppressant if blood pressure is better controlled.   5. Perennial allergic rhinitis - cetirizine (ZYRTEC) 10 MG tablet; Take 1 tablet (10 mg total) by mouth daily.  Dispense: 90 tablet; Refill: 3  6. Flu vaccine need Flu vaccine administered today. - Flu Vaccine  General Counseling:  Jaylena verbalizes understanding of the findings of todays visit and agrees with plan of treatment. I have discussed any further diagnostic evaluation that may be needed or ordered today. We also reviewed her medications today. she has been encouraged to call the office with any questions or concerns that should arise related to todays visit.  Hypertension Counseling:   The following hypertensive lifestyle modification were recommended and discussed:  1. Limiting alcohol intake to less than 1 oz/day of ethanol:(24 oz of beer or 8 oz of wine or 2 oz of 100-proof whiskey). 2. Take baby ASA 81 mg daily. 3. Importance of regular aerobic exercise and losing weight. 4. Reduce dietary saturated fat and cholesterol intake for overall cardiovascular health. 5. Maintaining adequate dietary potassium, calcium, and magnesium intake. 6. Regular monitoring of the blood pressure. 7. Reduce sodium intake to less than 100 mmol/day (less than 2.3 gm of sodium or less than 6 gm of sodium choride)   This patient was seen by [place] with Dr Lavera Guise as a part of collaborative care agreement  Orders Placed This Encounter  Procedures  . Flu Vaccine    Meds ordered this encounter  Medications  . metoprolol succinate (TOPROL-XL) 25 MG 24 hr tablet    Sig: Take 1 tablet (25 mg total) by mouth daily.    Dispense:  90 tablet    Refill:  3    Order Specific Question:   Supervising Provider    Answer:   Lavera Guise [[id number]]  . cetirizine (ZYRTEC) 10 MG tablet    Sig: Take 1 tablet (10 mg total) by mouth daily.    Dispense:  90 tablet    Refill:  3    Order Specific Question:   Supervising Provider    Answer:   Lavera Guise [[id number]]  . estrogen, conjugated,-medroxyprogesterone (PREMPRO) 0.45-1.5 MG tablet    Sig: Take 1 tablet by mouth daily.    Dispense:  84 tablet    Refill:  3    Order  Specific Question:   Supervising Provider    Answer:   Lyndon Code [[id number]]  . famotidine  (PEPCID) 20 MG tablet    Sig: Take 1 tablet (20 mg total) by mouth 2 (two) times daily.    Dispense:  180 tablet    Refill:  3    Order Specific Question:   Supervising Provider    Answer:   Lyndon Code [[id number]]    Time spent: 60 Minutes      Dr Lyndon Code Internal medicine

## 2019-02-05 ENCOUNTER — Telehealth: Payer: Self-pay

## 2019-02-05 NOTE — Telephone Encounter (Signed)
Patient called lmom stating needed to cancel appointment on 02/07/2019 still needs to have lab work done, will call back to reschedule appointment. klh

## 2019-02-05 NOTE — Telephone Encounter (Signed)
LMOM FOR PATIENT TO CALL AND CONFIRM 02-07-19 OV AS VIRTUAL IF HAS ACCESS TO BP CUFF IF NOT SCREEN AND MAKE IN OFFICE.

## 2019-02-07 ENCOUNTER — Ambulatory Visit: Payer: BC Managed Care – PPO | Admitting: Nurse Practitioner

## 2019-12-16 ENCOUNTER — Ambulatory Visit (INDEPENDENT_AMBULATORY_CARE_PROVIDER_SITE_OTHER): Payer: BC Managed Care – PPO

## 2019-12-16 ENCOUNTER — Other Ambulatory Visit: Payer: Self-pay

## 2019-12-16 DIAGNOSIS — Z23 Encounter for immunization: Secondary | ICD-10-CM

## 2019-12-22 ENCOUNTER — Other Ambulatory Visit: Payer: Self-pay

## 2019-12-22 DIAGNOSIS — Z780 Asymptomatic menopausal state: Secondary | ICD-10-CM

## 2019-12-22 MED ORDER — PREMPRO 0.45-1.5 MG PO TABS: 1.0000 | ORAL_TABLET | Freq: Every day | ORAL | 0 refills | Status: AC

## 2020-01-20 ENCOUNTER — Ambulatory Visit: Payer: BC Managed Care – PPO | Admitting: Nurse Practitioner

## 2021-02-17 ENCOUNTER — Telehealth: Payer: Self-pay | Admitting: Family Medicine

## 2021-02-17 NOTE — Telephone Encounter (Signed)
Pt would like to speak to a nurse about when her 3rd dose of Twinrix is due.  According to the [facility] it says it is due on 1/19 but she was told it would be due in December and she is confused about when she should schedule her next dose. Please call.

## 2021-02-22 NOTE — Telephone Encounter (Signed)
Upon [facility] review, pt received Twinrix dose #3 on 02/18/2021 at [facility]. Jerel Shepherd, RN

## 2022-01-27 ENCOUNTER — Other Ambulatory Visit: Payer: Self-pay | Admitting: Family Medicine

## 2022-01-27 ENCOUNTER — Ambulatory Visit
Admission: RE | Admit: 2022-01-27 | Discharge: 2022-01-27 | Disposition: A | Discharge status: Home / Self Care | Payer: BC Managed Care – PPO | Source: Ambulatory Visit | Attending: Family Medicine | Admitting: Family Medicine

## 2022-01-27 DIAGNOSIS — R109 Unspecified abdominal pain: Secondary | ICD-10-CM

## 2022-02-15 ENCOUNTER — Other Ambulatory Visit: Payer: Self-pay | Admitting: Family Medicine

## 2022-02-15 DIAGNOSIS — N289 Disorder of kidney and ureter, unspecified: Secondary | ICD-10-CM

## 2022-02-22 ENCOUNTER — Ambulatory Visit
Admission: RE | Admit: 2022-02-22 | Discharge: 2022-02-22 | Disposition: A | Discharge status: Home / Self Care | Payer: BC Managed Care – PPO | Source: Ambulatory Visit | Attending: Family Medicine | Admitting: Family Medicine

## 2022-02-22 DIAGNOSIS — N289 Disorder of kidney and ureter, unspecified: Secondary | ICD-10-CM | POA: Insufficient documentation

## 2022-02-22 MED ORDER — GADOBUTROL 1 MMOL/ML IV SOLN
9.0000 mL | Freq: Once | INTRAVENOUS | Status: AC | PRN
  Administered 2022-02-22: 9 mL via INTRAVENOUS
